# Patient Record
Sex: Male | Born: 2000 | Race: White | Hispanic: No | Marital: Single | Smoking: Never smoker
Health system: Southern US, Community
[De-identification: ages and names within clinical notes are randomized; demographics above are authoritative.]

## PROBLEM LIST (undated history)

## (undated) DIAGNOSIS — T8859XA Other complications of anesthesia, initial encounter: Secondary | ICD-10-CM

## (undated) DIAGNOSIS — E119 Type 2 diabetes mellitus without complications: Secondary | ICD-10-CM

## (undated) DIAGNOSIS — T4145XA Adverse effect of unspecified anesthetic, initial encounter: Secondary | ICD-10-CM

## (undated) DIAGNOSIS — Z8489 Family history of other specified conditions: Secondary | ICD-10-CM

## (undated) DIAGNOSIS — R011 Cardiac murmur, unspecified: Secondary | ICD-10-CM

## (undated) DIAGNOSIS — S4990XA Unspecified injury of shoulder and upper arm, unspecified arm, initial encounter: Secondary | ICD-10-CM

---
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
## 2020-11-15 ENCOUNTER — Emergency Department (HOSPITAL_COMMUNITY)
Admission: EM | Admit: 2020-11-15 | Discharge: 2020-11-15 | Disposition: A | Discharge status: Home / Self Care | Payer: Worker's Compensation | Attending: Emergency Medicine | Admitting: Emergency Medicine

## 2020-11-15 ENCOUNTER — Other Ambulatory Visit: Payer: Self-pay

## 2020-11-15 DIAGNOSIS — W231XXA Caught, crushed, jammed, or pinched between stationary objects, initial encounter: Secondary | ICD-10-CM | POA: Diagnosis not present

## 2020-11-15 DIAGNOSIS — S61214A Laceration without foreign body of right ring finger without damage to nail, initial encounter: Secondary | ICD-10-CM | POA: Insufficient documentation

## 2020-11-15 DIAGNOSIS — E119 Type 2 diabetes mellitus without complications: Secondary | ICD-10-CM | POA: Insufficient documentation

## 2020-11-15 DIAGNOSIS — Z23 Encounter for immunization: Secondary | ICD-10-CM | POA: Diagnosis not present

## 2020-11-15 DIAGNOSIS — S6991XA Unspecified injury of right wrist, hand and finger(s), initial encounter: Secondary | ICD-10-CM | POA: Diagnosis present

## 2020-11-15 MED ORDER — LIDOCAINE HCL 2 % IJ SOLN
20.0000 mL | Freq: Once | INTRAMUSCULAR | Status: AC
  Administered 2020-11-15: 400 mg via INTRADERMAL
  Filled 2020-11-15: qty 20

## 2020-11-15 MED ORDER — OXYCODONE-ACETAMINOPHEN 5-325 MG PO TABS
1.0000 | ORAL_TABLET | Freq: Once | ORAL | Status: AC
  Administered 2020-11-15: 1 via ORAL
  Filled 2020-11-15: qty 1

## 2020-11-15 MED ORDER — CEPHALEXIN 500 MG PO CAPS: 500.0000 mg | ORAL_CAPSULE | Freq: Two times a day (BID) | ORAL | 0 refills | Status: AC

## 2020-11-15 MED ORDER — TETANUS-DIPHTH-ACELL PERTUSSIS 5-2.5-18.5 LF-MCG/0.5 IM SUSY
0.5000 mL | PREFILLED_SYRINGE | Freq: Once | INTRAMUSCULAR | Status: AC
  Administered 2020-11-15: 0.5 mL via INTRAMUSCULAR
  Filled 2020-11-15: qty 0.5

## 2020-11-15 NOTE — ED Notes (Signed)
Pt soaking fingers with saline and betadine at this time.

## 2020-11-15 NOTE — ED Provider Notes (Signed)
Emergency Medicine Provider Triage Evaluation Note  Alex Medina , a 20 y.o. male  was evaluated in triage.  Pt complains of R hand injury.  Review of Systems  Positive: Crushed injury to R dominant hand Negative: numbness  Physical Exam  BP (!) 141/85 (BP Location: Left Arm)   Pulse 78   Temp 98.8 F (37.1 C) (Oral)   Resp 17   SpO2 98%  Gen:   Awake, no distress   Resp:  Normal effort  MSK:   Moves extremities without difficulty  Other:  Dressing noted to fingers on the R hand, unable to fully visualized fingers  Medical Decision Making  Medically screening exam initiated at 2:49 PM.  Appropriate orders placed.  ELEK HOLDERNESS was informed that the remainder of the evaluation will be completed by another provider, this initial triage assessment does not replace that evaluation, and the importance of remaining in the ED until their evaluation is complete.  Crushed injury to R hand involving 2nd, 3rd, 4th fingers on dominant hand from a machine at work.  Was seen at [facility], had xray but sent here due to pt's pain and inability to fully examine the injury   Fayrene Helper, PA-C 11/15/20 1451    Milagros Loll, MD 11/16/20 [phone]

## 2020-11-15 NOTE — Progress Notes (Signed)
Orthopedic Tech Progress Note Patient Details:  KELAN PRITT Oct 17, 2000 [id number]  Ortho Devices Type of Ortho Device: Finger splint Ortho Device/Splint Location: 3rd,4th Finger Ortho Device/Splint Interventions: Ordered,Application   Post Interventions Patient Tolerated: Well Instructions Provided: Care of device   Mattia Osterman A Giovonnie Trettel 11/15/2020, 7:17 PM

## 2020-11-15 NOTE — ED Triage Notes (Signed)
Pt arrives from [facility] for further eval of injury to R index, middle, and ring fingers after smashing it in a brake. Had xr at [facility] which confirmed break in middle and ring fingers and possibly index. Bleeding controlled. Sensation somewhat impaired to all three fingers and is unable to move the fingers.

## 2020-11-15 NOTE — ED Notes (Signed)
PA at bedside completing finger lac

## 2020-11-15 NOTE — Discharge Instructions (Addendum)
I have placed 3 stitches to your right ring finger, you will need to have these removed within 7 to 10 days.  If you experience any redness, drainage, wound looks infected you will need to return sooner.  I have prescribed antibiotics help prevent further infection, please take 1 tablet twice a day for the next 7 days.   The number to Dr. Orlan Leavens, hand specialist to further evaluate your hand fracture.

## 2020-11-15 NOTE — ED Provider Notes (Addendum)
[facility] EMERGENCY DEPARTMENT Provider Note   CSN: [id number] Arrival date & time: 11/15/20  1416     History Chief Complaint  Patient presents with  . Finger Injury    Alex Medina is a 19 y.o. male.  20 y.o male with a no PMH presents to the ED with a chief complaint of finger injury. Patient reports he was at work when he had a breaker fall on the right side of his hand, this crushed the right small finger, right ring finger, right index finger.  He was evaluated at urgent care, due to severe pain and sent to the ED for repair of his laceration.  He reports limited range of motion to his fingers, last tetanus immunization was updated at urgent care, he was also given a Percocet to help with pain control.  Denies any other injury or complaint.   The history is provided by the patient and medical records.       Past Medical History:  Diagnosis Date  . Complication of anesthesia    "medicated with something to rest shoulder(in ED)and was goofy  . Diabetes mellitus without complication (HCC)    "they said something about being boderline" mother  . Family history of adverse reaction to anesthesia    Grandmother's wake up confused  . Heart murmur   . Shoulder injury     There are no problems to display for this patient.   Past Surgical History:  Procedure Laterality Date  . SHOULDER ARTHROSCOPY WITH LABRAL REPAIR Right 06/06/2015   Procedure: SHOULDER DIAGNOSTIC OPERATIVE ARTHROSCOPY WITH LABRAL REPAIR;  Surgeon: Cammy Copa, MD;  Location: [facility] OR;  Service: Orthopedics;  Laterality: Right;       Family History  Problem Relation Age of Onset  . Heart disease Sister   . Aortic stenosis Sister   . Heart disease Maternal Grandmother   . Diabetes Maternal Grandmother   . Hyperlipidemia Maternal Grandmother   . Stroke Maternal Grandfather   . Diabetes Paternal Grandmother   . Cancer Paternal Grandfather   . Alcohol abuse Other   .  Varicose Veins Other   . Aortic stenosis Sister     Social History   Tobacco Use  . Smoking status: Never Smoker  Substance Use Topics  . Alcohol use: No  . Drug use: No    Home Medications Prior to Admission medications   Medication Sig Start Date End Date Taking? Authorizing Provider  cephALEXin (KEFLEX) 500 MG capsule Take 1 capsule (500 mg total) by mouth 2 (two) times daily for 7 days. 11/15/20 11/22/20 Yes Naylah Cork, Leonie Douglas, PA-C  methocarbamol (ROBAXIN) 500 MG tablet Take 1 tablet (500 mg total) by mouth 4 (four) times daily. Patient not taking: Reported on 11/15/2020 06/06/15   Cammy Copa, MD  oxyCODONE-acetaminophen (ROXICET) 5-325 MG tablet Take 1-2 tablets by mouth every 6 (six) hours as needed for severe pain. Patient not taking: Reported on 11/15/2020 06/06/15   Cammy Copa, MD    Allergies    Patient has no known allergies.  Review of Systems   Review of Systems  Constitutional: Negative for diaphoresis and fever.  Musculoskeletal: Positive for arthralgias.  Skin: Positive for wound.  All other systems reviewed and are negative.   Physical Exam Updated Vital Signs BP 128/90 (BP Location: Left Arm)   Pulse 68   Temp 98.8 F (37.1 C) (Oral)   Resp 14   Ht 5\' 9"  (1.753 m)   Wt 86.2  kg   SpO2 100%   BMI 28.06 kg/m   Physical Exam Vitals and nursing note reviewed.  Constitutional:      Appearance: Normal appearance.  HENT:     Head: Normocephalic and atraumatic.     Nose: Nose normal.  Cardiovascular:     Rate and Rhythm: Normal rate.  Pulmonary:     Effort: Pulmonary effort is normal.  Musculoskeletal:     Right hand: Deformity, tenderness and bony tenderness present. Decreased range of motion. Normal strength. Normal sensation. There is no disruption of two-point discrimination. Normal capillary refill. Normal pulse.     Cervical back: Normal range of motion and neck supple.     Comments: Small laceration to the right ring finger, bleeding  is controlled.  Bruising noted to the bases of the middle, ring, small right finger.  Decreased range of motion due to pain.  Skin:    General: Skin is warm and dry.     Findings: Erythema present.  Neurological:     Mental Status: He is alert and oriented to person, place, and time.     ED Results / Procedures / Treatments   Labs (all labs ordered are listed, but only abnormal results are displayed) Labs Reviewed - No data to display  EKG None  Radiology No results found.  Procedures .Marland KitchenLaceration Repair  Date/Time: 11/15/2020 6:27 PM Performed by: Claude Manges, PA-C Authorized by: Claude Manges, PA-C   Consent:    Consent obtained:  Verbal   Consent given by:  Patient   Risks, benefits, and alternatives were discussed: yes     Risks discussed:  Pain, need for additional repair, poor cosmetic result and poor wound healing   Alternatives discussed:  No treatment Universal protocol:    Patient identity confirmed:  Verbally with patient Anesthesia:    Anesthesia method:  Local infiltration   Local anesthetic:  Lidocaine 2% w/o epi Laceration details:    Location:  Finger   Finger location:  R ring finger   Length (cm):  1   Depth (mm):  0.5 Exploration:    Hemostasis achieved with:  Direct pressure and epinephrine Treatment:    Area cleansed with:  Saline and povidone-iodine   Amount of cleaning:  Standard   Irrigation solution:  Sterile water   Debridement:  Moderate   Undermining:  Extensive Skin repair:    Repair method:  Sutures   Suture size:  4-0   Suture material:  Prolene   Suture technique:  Simple interrupted   Number of sutures:  3 Approximation:    Approximation:  Close Repair type:    Repair type:  Simple Post-procedure details:    Dressing:  Splint for protection   Procedure completion:  Tolerated well, no immediate complications     Medications Ordered in ED Medications  oxyCODONE-acetaminophen (PERCOCET/ROXICET) 5-325 MG per tablet 1  tablet (1 tablet Oral Given 11/15/20 1457)  Tdap (BOOSTRIX) injection 0.5 mL (0.5 mLs Intramuscular Given 11/15/20 1704)  lidocaine (XYLOCAINE) 2 % (with pres) injection 400 mg (400 mg Intradermal Given 11/15/20 1745)    ED Course  I have reviewed the triage vital signs and the nursing notes.  Pertinent labs & imaging results that were available during my care of the patient were reviewed by me and considered in my medical decision making (see chart for details).    MDM Rules/Calculators/A&P  Patient presents to the ED status post right hand injury, reports having a break her fall onto his right hand,  crush injury noted.  Evaluated in urgent care prior to ED, sent here for repair of his laceration.  According to urgent care's note, patient was unable to tolerate procedure, he was in "too much pain ", patient was under the impression that he will likely receive sedation while in the ED, I discussed with patient that this will not be the case, we could try a digital block along with some oral medication, he did receive Percocet while in urgent care.  His tetanus immunization was updated then.  X-ray showed  XR HAND RIGHT (ROUTINE: AP,LAT,OBL)  Final Result   1. Comminuted mildly displaced fractures of the tufts of the third and fourth distal phalanges.  2. Other bones appear intact. No dislocation. No radiopaque foreign body.   During evaluation there is limited range of motion due to pain, bruising noted to the base of the middle, ring, small finger.  There is a small laceration to the ring finger, this wound was soaked in saline along with Betadine.  We will perform digital block along with repair wound.   Wound was repaired, he was placed on antibiotics to help with infection. Patient understands and agrees with management.    Portions of this note were generated with Scientist, clinical (histocompatibility and immunogenetics). Dictation errors may occur despite best attempts at proofreading.  Final Clinical Impression(s) / ED  Diagnoses Final diagnoses:  Laceration of right ring finger without foreign body, nail damage status unspecified, initial encounter    Rx / DC Orders ED Discharge Orders         Ordered    cephALEXin (KEFLEX) 500 MG capsule  2 times daily        11/15/20 1838           Claude Manges, PA-C 11/15/20 1840    Claude Manges, PA-C 11/15/20 1937    Rozelle Logan, DO 11/15/20 2339
